# Patient Record
Sex: Female | Born: 1975 | Race: Asian | Hispanic: No | Marital: Married | Smoking: Never smoker
Health system: Southern US, Community
[De-identification: ages and names within clinical notes are randomized; demographics above are authoritative.]

## PROBLEM LIST (undated history)

## (undated) DIAGNOSIS — E7800 Pure hypercholesterolemia, unspecified: Secondary | ICD-10-CM

## (undated) DIAGNOSIS — I10 Essential (primary) hypertension: Secondary | ICD-10-CM

---

## 2002-01-28 ENCOUNTER — Other Ambulatory Visit: Admission: RE | Admit: 2002-01-28 | Discharge: 2002-01-28 | Payer: Self-pay | Admitting: Obstetrics and Gynecology

## 2002-03-21 ENCOUNTER — Ambulatory Visit (HOSPITAL_COMMUNITY): Admission: RE | Admit: 2002-03-21 | Discharge: 2002-03-21 | Payer: Self-pay | Admitting: Obstetrics and Gynecology

## 2002-03-21 ENCOUNTER — Encounter: Payer: Self-pay | Admitting: Obstetrics and Gynecology

## 2002-06-28 ENCOUNTER — Encounter: Admission: RE | Admit: 2002-06-28 | Discharge: 2002-06-28 | Payer: Self-pay | Admitting: Obstetrics and Gynecology

## 2002-07-21 ENCOUNTER — Encounter (HOSPITAL_COMMUNITY): Admission: RE | Admit: 2002-07-21 | Discharge: 2002-08-04 | Payer: Self-pay | Admitting: Obstetrics and Gynecology

## 2002-08-09 ENCOUNTER — Inpatient Hospital Stay (HOSPITAL_COMMUNITY): Admission: AD | Admit: 2002-08-09 | Discharge: 2002-08-13 | Payer: Self-pay | Admitting: Obstetrics and Gynecology

## 2003-08-28 ENCOUNTER — Emergency Department (HOSPITAL_COMMUNITY): Admission: EM | Admit: 2003-08-28 | Discharge: 2003-08-28 | Payer: Self-pay | Admitting: Emergency Medicine

## 2003-08-30 ENCOUNTER — Other Ambulatory Visit: Admission: RE | Admit: 2003-08-30 | Discharge: 2003-08-30 | Payer: Self-pay | Admitting: Obstetrics and Gynecology

## 2004-10-07 ENCOUNTER — Other Ambulatory Visit: Admission: RE | Admit: 2004-10-07 | Discharge: 2004-10-07 | Payer: Self-pay | Admitting: Obstetrics and Gynecology

## 2005-10-19 ENCOUNTER — Inpatient Hospital Stay (HOSPITAL_COMMUNITY): Admission: AD | Admit: 2005-10-19 | Discharge: 2005-10-22 | Payer: Self-pay | Admitting: Obstetrics and Gynecology

## 2006-07-23 ENCOUNTER — Ambulatory Visit: Payer: Self-pay | Admitting: Internal Medicine

## 2010-06-30 HISTORY — PX: BREAST BIOPSY: SHX20

## 2010-07-24 ENCOUNTER — Other Ambulatory Visit: Payer: Self-pay | Admitting: Obstetrics and Gynecology

## 2010-07-24 DIAGNOSIS — N6320 Unspecified lump in the left breast, unspecified quadrant: Secondary | ICD-10-CM

## 2010-07-31 ENCOUNTER — Other Ambulatory Visit: Payer: Self-pay | Admitting: Obstetrics and Gynecology

## 2010-07-31 ENCOUNTER — Ambulatory Visit
Admission: RE | Admit: 2010-07-31 | Discharge: 2010-07-31 | Disposition: A | Discharge status: Home / Self Care | Payer: BC Managed Care – PPO | Source: Ambulatory Visit | Attending: Obstetrics and Gynecology | Admitting: Obstetrics and Gynecology

## 2010-07-31 DIAGNOSIS — N6320 Unspecified lump in the left breast, unspecified quadrant: Secondary | ICD-10-CM

## 2010-07-31 DIAGNOSIS — N6310 Unspecified lump in the right breast, unspecified quadrant: Secondary | ICD-10-CM

## 2010-08-06 ENCOUNTER — Other Ambulatory Visit: Payer: Self-pay | Admitting: Radiology

## 2010-08-06 ENCOUNTER — Ambulatory Visit
Admission: RE | Admit: 2010-08-06 | Discharge: 2010-08-06 | Disposition: A | Discharge status: Home / Self Care | Payer: BC Managed Care – PPO | Source: Ambulatory Visit | Attending: Obstetrics and Gynecology | Admitting: Obstetrics and Gynecology

## 2010-08-06 DIAGNOSIS — N6310 Unspecified lump in the right breast, unspecified quadrant: Secondary | ICD-10-CM

## 2010-08-06 DIAGNOSIS — N6320 Unspecified lump in the left breast, unspecified quadrant: Secondary | ICD-10-CM

## 2011-03-12 ENCOUNTER — Other Ambulatory Visit: Payer: Self-pay | Admitting: Otolaryngology

## 2011-03-12 DIAGNOSIS — R42 Dizziness and giddiness: Secondary | ICD-10-CM

## 2011-03-12 DIAGNOSIS — H905 Unspecified sensorineural hearing loss: Secondary | ICD-10-CM

## 2011-03-24 ENCOUNTER — Ambulatory Visit
Admission: RE | Admit: 2011-03-24 | Discharge: 2011-03-24 | Disposition: A | Discharge status: Home / Self Care | Payer: BC Managed Care – PPO | Source: Ambulatory Visit | Attending: Otolaryngology | Admitting: Otolaryngology

## 2011-03-24 DIAGNOSIS — H905 Unspecified sensorineural hearing loss: Secondary | ICD-10-CM

## 2011-03-24 DIAGNOSIS — R42 Dizziness and giddiness: Secondary | ICD-10-CM

## 2016-12-30 ENCOUNTER — Other Ambulatory Visit: Payer: Self-pay | Admitting: Family Medicine

## 2016-12-30 DIAGNOSIS — Z1231 Encounter for screening mammogram for malignant neoplasm of breast: Secondary | ICD-10-CM

## 2017-01-02 ENCOUNTER — Ambulatory Visit
Admission: RE | Admit: 2017-01-02 | Discharge: 2017-01-02 | Disposition: A | Discharge status: Home / Self Care | Payer: No Typology Code available for payment source | Source: Ambulatory Visit | Attending: Family Medicine | Admitting: Family Medicine

## 2017-01-02 DIAGNOSIS — Z1231 Encounter for screening mammogram for malignant neoplasm of breast: Secondary | ICD-10-CM

## 2017-10-02 ENCOUNTER — Emergency Department (HOSPITAL_COMMUNITY)
Admission: EM | Admit: 2017-10-02 | Discharge: 2017-10-02 | Disposition: A | Discharge status: Home / Self Care | Payer: BLUE CROSS/BLUE SHIELD | Attending: Emergency Medicine | Admitting: Emergency Medicine

## 2017-10-02 ENCOUNTER — Encounter (HOSPITAL_COMMUNITY): Payer: Self-pay | Admitting: Emergency Medicine

## 2017-10-02 ENCOUNTER — Emergency Department (HOSPITAL_COMMUNITY): Payer: BLUE CROSS/BLUE SHIELD

## 2017-10-02 DIAGNOSIS — R109 Unspecified abdominal pain: Secondary | ICD-10-CM | POA: Insufficient documentation

## 2017-10-02 LAB — I-STAT BETA HCG BLOOD, ED (MC, WL, AP ONLY): I-stat hCG, quantitative: <5 m[IU]/mL (ref <5)

## 2017-10-02 LAB — URINALYSIS, ROUTINE W REFLEX MICROSCOPIC
Bilirubin Urine: NEGATIVE
GLUCOSE, UA: NEGATIVE mg/dL
Ketones, ur: NEGATIVE mg/dL
NITRITE: NEGATIVE
Protein, ur: NEGATIVE mg/dL
SPECIFIC GRAVITY, URINE: 1.017 (ref 1.005–1.030)
pH: 6 (ref 5.0–8.0)

## 2017-10-02 LAB — CBC
HCT: 39.9 % (ref 36.0–46.0)
HEMOGLOBIN: 13 g/dL (ref 12.0–15.0)
MCH: 30.5 pg (ref 26.0–34.0)
MCHC: 32.6 g/dL (ref 30.0–36.0)
MCV: 93.7 fL (ref 78.0–100.0)
Platelets: 280 10*3/uL (ref 150–400)
RBC: 4.26 MIL/uL (ref 3.87–5.11)
RDW: 12 % (ref 11.5–15.5)
WBC: 8.9 10*3/uL (ref 4.0–10.5)

## 2017-10-02 LAB — BASIC METABOLIC PANEL
ANION GAP: 10 (ref 5–15)
BUN: 11 mg/dL (ref 6–20)
CHLORIDE: 105 mmol/L (ref 101–111)
CO2: 23 mmol/L (ref 22–32)
Calcium: 8.8 mg/dL — ABNORMAL LOW (ref 8.9–10.3)
Creatinine, Ser: 0.75 mg/dL (ref 0.44–1.00)
GFR calc non Af Amer: >60 mL/min (ref >60)
Glucose, Bld: 105 mg/dL — ABNORMAL HIGH (ref 65–99)
POTASSIUM: 3.6 mmol/L (ref 3.5–5.1)
Sodium: 138 mmol/L (ref 135–145)

## 2017-10-02 LAB — D-DIMER, QUANTITATIVE: D-Dimer, Quant: <0.27 ug/mL-FEU (ref 0.00–0.50)

## 2017-10-02 MED ORDER — ACETAMINOPHEN 325 MG PO TABS
650.0000 mg | ORAL_TABLET | Freq: Once | ORAL | Status: AC
  Administered 2017-10-02: 650 mg via ORAL
  Filled 2017-10-02: qty 2

## 2017-10-02 MED ORDER — IBUPROFEN 400 MG PO TABS
400.0000 mg | ORAL_TABLET | Freq: Once | ORAL | Status: AC | PRN
  Administered 2017-10-02: 400 mg via ORAL
  Filled 2017-10-02: qty 1

## 2017-10-02 NOTE — Discharge Instructions (Addendum)
Labs, CT renal, and urine looked ok today.  The lab test to rule out a blood clot in your lung was negative as well. The likelihood that you have a lung blood clot is very low.   I suspect you may have passed a small kidney stone, which could be causing delayed pain.    Take 1000 mg acetaminophen (tylenol) plus 600 mg ibuprofen (aleve, advil) every 6-8 hours for pain over the next 48 hours. Follow up with your doctor on Monday if pain persists. Return to the emergency department if you have worsening pain, chest pain, shortness of breath, rash, nausea, vomiting, changes in bowel movements or urine.

## 2017-10-02 NOTE — ED Notes (Signed)
BIB EMS from home reporting flank pain onset yesterday evening. Pt denies N/V/D. Denies hx of kidney stones

## 2017-10-02 NOTE — ED Provider Notes (Signed)
[facility]   CSN: [id number] Arrival date & time: 10/02/17  0030     History   Chief Complaint Chief Complaint  Patient presents with  . Flank Pain    HPI Katrina Brennan is a 42 y.o. female w/ no pmh here for evaluation of sudden onset right flank pain that radiates intermittently to right mid abdomen since yesterday morning, gradually worsening until last evening.  Initially worse with walking, yawning, sneezing, taking deep breaths. Pain is better now and mostly in flank, no longer in anterior abdomen. Has taken ibuprofen/tylenol with improvement in pain.  Currently on menstrual cycle day 3.  No fevers, chills, nausea, vomiting, acid reflux, urinary symptoms, abnormal vaginal dc or bleeding, changes in BM, cp, sob. No h/o DVT/PE, recent travel, estrogen use, malignancy. No abdominal surgeries.   HPI  History reviewed. No pertinent past medical history.  There are no active problems to display for this patient.   Past Surgical History:  Procedure Laterality Date  . BREAST BIOPSY Left 2012     OB History   None      Home Medications    Prior to Admission medications   Not on File    Family History No family history on file.  Social History Social History   Tobacco Use  . Smoking status: Never Smoker  . Smokeless tobacco: Never Used  Substance Use Topics  . Alcohol use: Not Currently  . Drug use: Not Currently     Allergies   Patient has no known allergies.   Review of Systems Review of Systems  Gastrointestinal: Positive for abdominal pain.  Genitourinary: Positive for flank pain.  All other systems reviewed and are negative.    Physical Exam Updated Vital Signs BP (!) 141/97   Pulse 70   Temp 98.7 F (37.1 C) (Oral)   Resp 16   SpO2 95%   Physical Exam  Constitutional: She is oriented to person, place, and time. She appears well-developed and well-nourished. No distress.  Non  toxic  HENT:  Head: Normocephalic and atraumatic.  Nose: Nose normal.  Mouth/Throat: No oropharyngeal exudate.  Moist mucous membranes   Eyes: Pupils are equal, round, and reactive to light. Conjunctivae and EOM are normal.  Neck: Normal range of motion.  Cardiovascular: Normal rate, regular rhythm and intact distal pulses.  No murmur heard. 2+ DP and radial pulses bilaterally. No LE edema.   Pulmonary/Chest: Effort normal and breath sounds normal. No respiratory distress. She has no wheezes. She has no rales.  Abdominal: Soft. Bowel sounds are normal. There is tenderness. There is CVA tenderness (right).  R CVAT with percussion not with palpation. No anterior abdominal tenderness, negative Murphy's and McBurney's. No G/R/R. No suprapubic or CVA tenderness.   Musculoskeletal: Normal range of motion. She exhibits no deformity.  Neurological: She is alert and oriented to person, place, and time.  Skin: Skin is warm and dry. Capillary refill takes less than 2 seconds.  Psychiatric: She has a normal mood and affect. Her behavior is normal. Judgment and thought content normal.  Nursing note and vitals reviewed.    ED Treatments / Results  Labs (all labs ordered are listed, but only abnormal results are displayed) Labs Reviewed  URINALYSIS, ROUTINE W REFLEX MICROSCOPIC - Abnormal; Notable for the following components:      Result Value   APPearance HAZY (*)    Hgb urine dipstick LARGE (*)    Leukocytes, UA SMALL (*)  Bacteria, UA RARE (*)    Squamous Epithelial / LPF 6-30 (*)    All other components within normal limits  BASIC METABOLIC PANEL - Abnormal; Notable for the following components:   Glucose, Bld 105 (*)    Calcium 8.8 (*)    All other components within normal limits  CBC  D-DIMER, QUANTITATIVE (NOT AT [facility])  I-STAT BETA HCG BLOOD, ED ([facility], [facility], [facility] ONLY)    EKG None  Radiology Ct Renal Stone Study  Result Date: 10/02/2017 CLINICAL DATA:  Flank pain since yesterday.  Laterality is not indicated. EXAM: CT ABDOMEN AND PELVIS WITHOUT CONTRAST TECHNIQUE: Multidetector CT imaging of the abdomen and pelvis was performed following the standard protocol without IV contrast. COMPARISON:  None. FINDINGS: Lower chest: Mild dependent atelectasis in the lung bases. Hepatobiliary: No focal liver abnormality is seen. No gallstones, gallbladder wall thickening, or biliary dilatation. Pancreas: Unremarkable. No pancreatic ductal dilatation or surrounding inflammatory changes. Spleen: Normal in size without focal abnormality. Adrenals/Urinary Tract: Adrenal glands are unremarkable. Kidneys are normal, without renal calculi, focal lesion, or hydronephrosis. Bladder is unremarkable. Stomach/Bowel: Stomach is within normal limits. Appendix appears normal. No evidence of bowel wall thickening, distention, or inflammatory changes. Vascular/Lymphatic: No significant vascular findings are present. No enlarged abdominal or pelvic lymph nodes. Reproductive: Uterus and bilateral adnexa are unremarkable. Other: No abdominal wall hernia or abnormality. No abdominopelvic ascites. Musculoskeletal: No acute or significant osseous findings. IMPRESSION: 1. No renal or ureteral stone or obstruction. 2. No acute process demonstrated on noncontrast imaging of the abdomen and pelvis. Electronically Signed   By: Burman Nieves M.D.   On: 10/02/2017 04:19    Procedures Procedures (including critical care time)  Medications Ordered in ED Medications  ibuprofen (ADVIL,MOTRIN) tablet 400 mg (400 mg Oral Given 10/02/17 0035)  acetaminophen (TYLENOL) tablet 650 mg (650 mg Oral Given 10/02/17 0035)     Initial Impression / Assessment and Plan / ED Course  I have reviewed the triage vital signs and the nursing notes.  Pertinent labs & imaging results that were available during my care of the patient were reviewed by me and considered in my medical decision making (see chart for details).  Clinical Course as of  Oct 02 1032  Fri Oct 02, 2017  0747 Hgb urine dipstick(!): LARGE [CG]  0747 Leukocytes, UA(!): SMALL [CG]  0747 RBC / HPF: TOO NUMEROUS TO COUNT [CG]  0747 WBC, UA: 6-30 [CG]    Clinical Course User Index [CG] Liberty Handy, PA-C   ddx includes kidney stone, pyelonephritis, MSK injury. Less likely PE, pancreatitis, cholecystitis.  Exam as above with R CVAT, no anterior abdominal tenderness, peritonitis. She is no longer having pleuritic flank pain, making PE less likely.   Labs and imaging reviewed and remarkable for large hgb, small leukocytes however can be explained with current menses, she has no urinary symptoms. D-dimer negative. CT unremarkable. hcg negative.   Final Clinical Impressions(s) / ED Diagnoses   1015: Re-evaluated patient and pain remains in R CVA, no RUQ or epigastric pain. Unclear etiology of pain, possible passed stone. She has no abdominal tenderness, so less likely cholecystitis, pancreatitis, cholelithiasis. Will tx conservative and recommend close f/u with PCP in 48 hours, return to ED if fevers, chills, n/v, worsening abdominal pain.  Final diagnoses:  Right flank pain    ED Discharge Orders    None       Jerrell Mylar 10/02/17 1034    Tegeler, Canary Brim, MD 10/02/17  1843  

## 2017-11-27 ENCOUNTER — Other Ambulatory Visit: Payer: Self-pay | Admitting: Family Medicine

## 2017-11-27 DIAGNOSIS — Z1231 Encounter for screening mammogram for malignant neoplasm of breast: Secondary | ICD-10-CM

## 2018-01-04 ENCOUNTER — Ambulatory Visit
Admission: RE | Admit: 2018-01-04 | Discharge: 2018-01-04 | Disposition: A | Discharge status: Home / Self Care | Payer: BLUE CROSS/BLUE SHIELD | Source: Ambulatory Visit | Attending: Family Medicine | Admitting: Family Medicine

## 2018-01-04 DIAGNOSIS — Z1231 Encounter for screening mammogram for malignant neoplasm of breast: Secondary | ICD-10-CM

## 2018-11-29 ENCOUNTER — Other Ambulatory Visit: Payer: Self-pay | Admitting: Family Medicine

## 2018-11-29 DIAGNOSIS — Z1231 Encounter for screening mammogram for malignant neoplasm of breast: Secondary | ICD-10-CM

## 2019-01-14 ENCOUNTER — Ambulatory Visit
Admission: RE | Admit: 2019-01-14 | Discharge: 2019-01-14 | Disposition: A | Discharge status: Home / Self Care | Payer: BC Managed Care – PPO | Source: Ambulatory Visit | Attending: Family Medicine | Admitting: Family Medicine

## 2019-01-14 DIAGNOSIS — Z1231 Encounter for screening mammogram for malignant neoplasm of breast: Secondary | ICD-10-CM

## 2020-02-17 ENCOUNTER — Other Ambulatory Visit: Payer: Self-pay | Admitting: Family Medicine

## 2020-02-17 DIAGNOSIS — Z1231 Encounter for screening mammogram for malignant neoplasm of breast: Secondary | ICD-10-CM

## 2020-03-08 ENCOUNTER — Ambulatory Visit: Payer: BC Managed Care – PPO

## 2020-03-30 ENCOUNTER — Ambulatory Visit
Admission: RE | Admit: 2020-03-30 | Discharge: 2020-03-30 | Disposition: A | Discharge status: Home / Self Care | Payer: BC Managed Care – PPO | Source: Ambulatory Visit | Attending: Family Medicine | Admitting: Family Medicine

## 2020-03-30 ENCOUNTER — Other Ambulatory Visit: Payer: Self-pay

## 2020-03-30 DIAGNOSIS — Z1231 Encounter for screening mammogram for malignant neoplasm of breast: Secondary | ICD-10-CM

## 2021-04-08 ENCOUNTER — Other Ambulatory Visit: Payer: Self-pay | Admitting: Family Medicine

## 2021-04-08 DIAGNOSIS — Z1231 Encounter for screening mammogram for malignant neoplasm of breast: Secondary | ICD-10-CM

## 2021-04-12 ENCOUNTER — Emergency Department (HOSPITAL_COMMUNITY)
Admission: EM | Admit: 2021-04-12 | Discharge: 2021-04-12 | Disposition: A | Discharge status: Home / Self Care | Payer: BC Managed Care – PPO | Attending: Emergency Medicine | Admitting: Emergency Medicine

## 2021-04-12 ENCOUNTER — Emergency Department (HOSPITAL_COMMUNITY): Payer: BC Managed Care – PPO

## 2021-04-12 ENCOUNTER — Other Ambulatory Visit: Payer: Self-pay

## 2021-04-12 ENCOUNTER — Encounter (HOSPITAL_COMMUNITY): Payer: Self-pay

## 2021-04-12 DIAGNOSIS — R1011 Right upper quadrant pain: Secondary | ICD-10-CM

## 2021-04-12 DIAGNOSIS — K8050 Calculus of bile duct without cholangitis or cholecystitis without obstruction: Secondary | ICD-10-CM

## 2021-04-12 LAB — CBC WITH DIFFERENTIAL/PLATELET
Abs Immature Granulocytes: 0.02 10*3/uL (ref 0.00–0.07)
Basophils Absolute: 0.1 10*3/uL (ref 0.0–0.1)
Basophils Relative: 1 %
Eosinophils Absolute: 0.1 10*3/uL (ref 0.0–0.5)
Eosinophils Relative: 1 %
HCT: 37.2 % (ref 36.0–46.0)
Hemoglobin: 12.4 g/dL (ref 12.0–15.0)
Immature Granulocytes: 0 %
Lymphocytes Relative: 21 %
Lymphs Abs: 2 10*3/uL (ref 0.7–4.0)
MCH: 32.3 pg (ref 26.0–34.0)
MCHC: 33.3 g/dL (ref 30.0–36.0)
MCV: 96.9 fL (ref 80.0–100.0)
Monocytes Absolute: 0.9 10*3/uL (ref 0.1–1.0)
Monocytes Relative: 10 %
Neutro Abs: 6.3 10*3/uL (ref 1.7–7.7)
Neutrophils Relative %: 67 %
Platelets: 241 10*3/uL (ref 150–400)
RBC: 3.84 MIL/uL — ABNORMAL LOW (ref 3.87–5.11)
RDW: 11.9 % (ref 11.5–15.5)
WBC: 9.5 10*3/uL (ref 4.0–10.5)
nRBC: 0 % (ref 0.0–0.2)

## 2021-04-12 LAB — COMPREHENSIVE METABOLIC PANEL
ALT: 13 U/L (ref 0–44)
AST: 17 U/L (ref 15–41)
Albumin: 4 g/dL (ref 3.5–5.0)
Alkaline Phosphatase: 37 U/L — ABNORMAL LOW (ref 38–126)
Anion gap: 7 (ref 5–15)
BUN: 10 mg/dL (ref 6–20)
CO2: 24 mmol/L (ref 22–32)
Calcium: 9.2 mg/dL (ref 8.9–10.3)
Chloride: 104 mmol/L (ref 98–111)
Creatinine, Ser: 0.7 mg/dL (ref 0.44–1.00)
GFR, Estimated: >60 mL/min (ref >60)
Glucose, Bld: 124 mg/dL — ABNORMAL HIGH (ref 70–99)
Potassium: 3.3 mmol/L — ABNORMAL LOW (ref 3.5–5.1)
Sodium: 135 mmol/L (ref 135–145)
Total Bilirubin: 0.4 mg/dL (ref 0.3–1.2)
Total Protein: 7.5 g/dL (ref 6.5–8.1)

## 2021-04-12 LAB — URINALYSIS, ROUTINE W REFLEX MICROSCOPIC
Bilirubin Urine: NEGATIVE
Glucose, UA: NEGATIVE mg/dL
Hgb urine dipstick: NEGATIVE
Ketones, ur: NEGATIVE mg/dL
Leukocytes,Ua: NEGATIVE
Nitrite: NEGATIVE
Protein, ur: NEGATIVE mg/dL
Specific Gravity, Urine: 1.009 (ref 1.005–1.030)
pH: 8 (ref 5.0–8.0)

## 2021-04-12 LAB — LIPASE, BLOOD: Lipase: 30 U/L (ref 11–51)

## 2021-04-12 MED ORDER — OXYCODONE-ACETAMINOPHEN 5-325 MG PO TABS
1.0000 | ORAL_TABLET | Freq: Once | ORAL | Status: AC
  Administered 2021-04-12: 1 via ORAL
  Filled 2021-04-12: qty 1

## 2021-04-12 MED ORDER — HYDROCODONE-ACETAMINOPHEN 5-325 MG PO TABS: 1.0000 | ORAL_TABLET | Freq: Four times a day (QID) | ORAL | 0 refills | Status: DC | PRN

## 2021-04-12 NOTE — Discharge Instructions (Addendum)
You have swelling around your gallbladder.   Take Vicodin for pain.  Call surgery on Monday for appointment  Avoid eating fatty food  Return to ER if you have worse abdominal pain, vomiting, fever

## 2021-04-12 NOTE — ED Triage Notes (Signed)
Pt c/o RUQ pain that radiates to her back since Wednesday. Pt state constant, pulsing pain that has gotten worse. Denies N/V/D.

## 2021-04-12 NOTE — ED Provider Notes (Signed)
Emergency Medicine Provider Triage Evaluation Note  Katrina Brennan , a 45 y.o. female  was evaluated in triage.  Pt complains of right upper quadrant abdominal pain that has been present since Wednesday and worsening.  Described as a pulsing and throbbing pain.  Radiates back to the right side of her back.  No associated nausea or vomiting, normal bowel movements, no dysuria or hematuria, no fevers, no prior abdominal surgeries..  Review of Systems  Positive: Abdominal pain Negative: Dysuria, hematuria, nausea, vomiting, diarrhea, fever  Physical Exam  BP (!) 176/91 (BP Location: Left Arm)   Pulse 90   Temp 98 F (36.7 C) (Oral)   Resp 18   Ht 4\' 11"  (1.499 m)   Wt 64 kg   SpO2 96%   BMI 28.48 kg/m  Gen:   Awake, no distress   Resp:  Normal effort  MSK:   Moves extremities without difficulty  Other:  Focal right upper quadrant abdominal pain with some mild right flank tenderness  Medical Decision Making  Medically screening exam initiated at 11:35 AM.  Appropriate orders placed.  Katrina Brennan was informed that the remainder of the evaluation will be completed by another provider, this initial triage assessment does not replace that evaluation, and the importance of remaining in the ED until their evaluation is complete.     Katrina Giovanni, PA-C 04/12/21 1139    04/14/21, MD 04/13/21 [phone]

## 2021-04-12 NOTE — ED Provider Notes (Signed)
[facility]-EMERGENCY DEPT Provider Note   CSN: [id number] Arrival date & time: 04/12/21  1117     History Chief Complaint  Patient presents with   Abdominal Pain    Katrina Brennan is a 45 y.o. female otherwise healthy here presenting with right upper quadrant pain and back pain.  Patient states that she has been having right upper quadrant pain for the last 2 days.  She states that it is sharp and radiates to the back.  No associated nausea or vomiting.  Denies any previous abdominal surgeries.  Denies any chest pain.  Patient does eat some fatty food.   The history is provided by the patient.      History reviewed. No pertinent past medical history.  There are no problems to display for this patient.   Past Surgical History:  Procedure Laterality Date   BREAST BIOPSY Left 2012     OB History   No obstetric history on file.     History reviewed. No pertinent family history.  Social History   Tobacco Use   Smoking status: Never   Smokeless tobacco: Never  Substance Use Topics   Alcohol use: Not Currently   Drug use: Not Currently    Home Medications Prior to Admission medications   Not on File    Allergies    Patient has no known allergies.  Review of Systems   Review of Systems  Gastrointestinal:  Positive for abdominal pain.  All other systems reviewed and are negative.  Physical Exam Updated Vital Signs BP (!) 163/92   Pulse 82   Temp 98.7 F (37.1 C) (Oral)   Resp 16   Ht 4\' 11"  (1.499 m)   Wt 64 kg   LMP 03/29/2021 Comment: birth contorl  SpO2 100%   BMI 28.48 kg/m   Physical Exam Vitals and nursing note reviewed.  Constitutional:      Comments: Slightly uncomfortable  HENT:     Head: Normocephalic.     Mouth/Throat:     Mouth: Mucous membranes are moist.  Eyes:     Extraocular Movements: Extraocular movements intact.  Cardiovascular:     Rate and Rhythm: Normal rate and regular rhythm.     Heart  sounds: Normal heart sounds.  Pulmonary:     Effort: Pulmonary effort is normal.     Breath sounds: Normal breath sounds.  Abdominal:     General: Abdomen is flat.     Comments: Mild right upper quadrant tenderness.  Negative Murphy sign.  No CVAT  Skin:    General: Skin is warm.     Capillary Refill: Capillary refill takes less than 2 seconds.  Neurological:     General: No focal deficit present.     Mental Status: She is alert and oriented to person, place, and time.  Psychiatric:        Mood and Affect: Mood normal.        Behavior: Behavior normal.    ED Results / Procedures / Treatments   Labs (all labs ordered are listed, but only abnormal results are displayed) Labs Reviewed  COMPREHENSIVE METABOLIC PANEL - Abnormal; Notable for the following components:      Result Value   Potassium 3.3 (*)    Glucose, Bld 124 (*)    Alkaline Phosphatase 37 (*)    All other components within normal limits  CBC WITH DIFFERENTIAL/PLATELET - Abnormal; Notable for the following components:   RBC 3.84 (*)    All  other components within normal limits  URINALYSIS, ROUTINE W REFLEX MICROSCOPIC - Abnormal; Notable for the following components:   APPearance TURBID (*)    Bacteria, UA RARE (*)    All other components within normal limits  LIPASE, BLOOD    EKG None  Radiology US Abdomen Limited RUQ (LIVER/GB)  Result Date: 04/12/2021 CLINICAL DATA:  Severe right upper quadrant pain. EXAM: ULTRASOUND ABDOMEN LIMITED RIGHT UPPER QUADRANT COMPARISON:  CT AP 10/02/2017 FINDINGS: Gallbladder: The gallbladder is collapsed. The gallbladder wall appears mildly thickened measuring 3 mm. No gallstones, sludge or pericholecystic fluid. Positive sonographic Murphy's sign reported. Gallbladder polyp is noted measuring 3 mm. Common bile duct: Diameter: 6.1 mm.  Normal diameter is considered to be 6 mm or less. Liver: No focal lesion identified. Within normal limits in parenchymal echogenicity. Portal vein  is patent on color Doppler imaging with normal direction of blood flow towards the liver. Other: None. IMPRESSION: 1. Collapsed gallbladder with mild, nonspecific, gallbladder wall thickening. A positive sonographic Murphy's sign was reported by the sonographer. No gallstones, sludge or pericholecystic fluid noted. 2. Upper limits of normal in caliber common bile duct. 3. Gallbladder polyp measures 3 mm. Electronically Signed   By: Signa Kell M.D.   On: 04/12/2021 13:11    Procedures Procedures   Medications Ordered in ED Medications  oxyCODONE-acetaminophen (PERCOCET/ROXICET) 5-325 MG per tablet 1 tablet (has no administration in time range)    ED Course  I have reviewed the triage vital signs and the nursing notes.  Pertinent labs & imaging results that were available during my care of the patient were reviewed by me and considered in my medical decision making (see chart for details).    MDM Rules/Calculators/A&P                           Katrina Brennan is a 45 y.o. female here presenting with right upper quadrant pain radiated to the back.  Concern for possible biliary colic.  Patient has right upper quadrant tenderness.  She overall appears well.  She has no vomiting or fever.  She is hypertensive likely due to pain.  I do not think she has dissection.  Right upper quadrant ultrasound showed gallbladder wall thickening with possible sonographic Murphy's but there is no other concerning signs for acute Coley.  Her white blood cell count is normal LFTs are normal.  I think she likely has biliary colic causing her symptoms.  Since she is pain-free now, will prescribe pain medicine and have her call surgery on Monday to schedule for follow-up and potential surgery.  Gave strict return precautions  Final Clinical Impression(s) / ED Diagnoses Final diagnoses:  RUQ abdominal pain    Rx / DC Orders ED Discharge Orders     None        Charlynne Pander, MD 04/12/21 [phone]

## 2021-04-29 ENCOUNTER — Ambulatory Visit: Payer: Self-pay | Admitting: Surgery

## 2021-04-29 NOTE — H&P (Signed)
History of Present Illness: Katrina Brennan is a 45 y.o. female who was referred to me for evaluation of RUQ abdominal pain. She recently presented to the ED on 04/12/21 with severe upper abdominal pain that radiated around the right flank to the back.  Ultrasound showed mild gallbladder wall thickening with a 65mm polyp, but no stones or sludge and no pericholecystic fluid. LFTs, WBC and lipase were normal. She was referred to discuss elective cholecystectomy. She had a similar episode in 2019 and thought she had a kidney stone, but CT scan at that time showed no nephroureterolithiasis. She says that she often has RUQ discomfort, particularly after eating spicy or fatty foods. She had recently eaten greasy food prior to her most recent episode of pain. She has been avoiding those foods since her ED visit and says this has helped with her symptoms.       Review of Systems: A complete review of systems was obtained from the patient.  I have reviewed this information and discussed as appropriate with the patient.  See HPI as well for other ROS.       Medical History: Past Medical History Past Medical History: Diagnosis Date  Hyperlipidemia    Hypertension        Patient Active Problem List Diagnosis  RUQ pain  Biliary colic     Past Surgical History Past Surgical History: Procedure Laterality Date  CESAREAN SECTION       2004 and 2007      Allergies No Known Allergies    Current Outpatient Medications on File Prior to Visit Medication Sig Dispense Refill  rosuvastatin (CRESTOR) 5 MG tablet Take 5 mg by mouth once daily      valsartan (DIOVAN) 80 MG tablet Take 80 mg by mouth once daily       No current facility-administered medications on file prior to visit.     Family History Family History Problem Relation Age of Onset  Asthma Mother        Social History   Tobacco Use Smoking Status Never Smoker Smokeless Tobacco Never Used     Social History Social  History    Socioeconomic History  Marital status: Married Tobacco Use  Smoking status: Never Smoker  Smokeless tobacco: Never Used Advertising account planner Use: Never used Substance and Sexual Activity  Alcohol use: Yes  Drug use: Defer  Sexual activity: Defer      Objective:     Vitals:   04/29/21 1327 BP: 134/62 Pulse: 74 Temp: 36.9 C (98.4 F) SpO2: 99% Weight: 64 kg (141 lb 3.2 oz) Height: 149.9 cm (4\' 11" )   Body mass index is 28.52 kg/m.   Physical Exam Vitals reviewed.  Constitutional:      General: She is not in acute distress.    Appearance: Normal appearance.  HENT:     Head: Normocephalic and atraumatic.  Eyes:     General: No scleral icterus.    Conjunctiva/sclera: Conjunctivae normal.  Cardiovascular:     Rate and Rhythm: Normal rate and regular rhythm.     Heart sounds: No murmur heard. Pulmonary:     Effort: Pulmonary effort is normal. No respiratory distress.     Breath sounds: Normal breath sounds. No wheezing.  Abdominal:     General: There is no distension.     Tenderness: There is no abdominal tenderness. There is no guarding.  Musculoskeletal:        General: No swelling or deformity. Normal range of motion.  Cervical back: Normal range of motion.  Skin:    General: Skin is warm and dry.     Coloration: Skin is not jaundiced.  Neurological:     General: No focal deficit present.     Mental Status: She is alert and oriented to person, place, and time.     Cranial Nerves: No cranial nerve deficit.  Psychiatric:        Mood and Affect: Mood normal.        Behavior: Behavior normal.        Thought Content: Thought content normal.            Assessment and Plan: Diagnoses and all orders for this visit:   RUQ pain   Biliary colic        This is a 45 year old female presenting with postprandial right upper quadrant discomfort and a recent episode of severe right upper quadrant pain.  I reviewed her ultrasound, which shows  thickening of the gallbladder wall but no obvious gallstones or sludge.  I discussed that typically gallbladder pain is caused by gallstones, however a functional gallbladder abnormality (I.e. biliary dyskinesia) can also cause the same symptoms.  She does not have stones on her ultrasound, but her symptoms are very typical of biliary colic and her gallbladder appears thickened and abnormal, suggesting a degree of chronic inflammation.  I discussed that we could obtain a HIDA to confirm the diagnosis of biliary dyskinesia, however since her symptoms are very typical of biliary colic and her gallbladder appears abnormal on imaging, I think it is reasonable to proceed with cholecystectomy.  I discussed the details of this procedure with the patient, including the risks of bleeding, infection, bile leak, and <0.5% risk of common bile duct injury. The patient expressed understanding and agrees to proceed with surgery.  She will be contacted to schedule an elective surgery date.  Sophronia Simas, MD [facility] Surgery General, Hepatobiliary and Pancreatic Surgery 04/29/21 2:10 PM

## 2021-04-30 ENCOUNTER — Emergency Department (HOSPITAL_BASED_OUTPATIENT_CLINIC_OR_DEPARTMENT_OTHER): Payer: BC Managed Care – PPO | Admitting: Radiology

## 2021-04-30 ENCOUNTER — Other Ambulatory Visit: Payer: Self-pay

## 2021-04-30 ENCOUNTER — Encounter (HOSPITAL_BASED_OUTPATIENT_CLINIC_OR_DEPARTMENT_OTHER): Payer: Self-pay

## 2021-04-30 ENCOUNTER — Emergency Department (HOSPITAL_BASED_OUTPATIENT_CLINIC_OR_DEPARTMENT_OTHER)
Admission: EM | Admit: 2021-04-30 | Discharge: 2021-04-30 | Disposition: A | Discharge status: Home / Self Care | Payer: BC Managed Care – PPO | Attending: Student | Admitting: Student

## 2021-04-30 DIAGNOSIS — T189XXA Foreign body of alimentary tract, part unspecified, initial encounter: Secondary | ICD-10-CM | POA: Diagnosis present

## 2021-04-30 DIAGNOSIS — X58XXXA Exposure to other specified factors, initial encounter: Secondary | ICD-10-CM | POA: Insufficient documentation

## 2021-04-30 DIAGNOSIS — R0989 Other specified symptoms and signs involving the circulatory and respiratory systems: Secondary | ICD-10-CM

## 2021-04-30 MED ORDER — LIDOCAINE VISCOUS HCL 2 % MT SOLN: 15.0000 mL | OROMUCOSAL | 0 refills | Status: AC | PRN

## 2021-04-30 MED ORDER — LIDOCAINE VISCOUS HCL 2 % MT SOLN
15.0000 mL | Freq: Once | OROMUCOSAL | Status: AC
  Administered 2021-04-30: 15 mL via OROMUCOSAL
  Filled 2021-04-30: qty 15

## 2021-04-30 NOTE — ED Triage Notes (Signed)
States eating felt chicken bone stuck in throat about 1 hour ago.  Went to UC first.  No sore of breath.  Pain to throat.

## 2021-04-30 NOTE — ED Provider Notes (Signed)
[facility] EMERGENCY DEPT Provider Note   CSN: [id number] Arrival date & time: 04/30/21  1348     History Chief Complaint  Patient presents with   Sore Throat    Chicken bone stuck in throat    Katrina Brennan is a 45 y.o. female who presents the emergency department for evaluation of a chicken bone ingestion and sore throat.  Patient states that proximately 5 hours prior to arrival she accidentally ingested a small chicken bone and feels a foreign body sensation in her throat.  She states that she went to an urgent care who told the patient that she might need to have surgery and to go to the emergency department immediately.  Patient has no complaints of dyspnea, wheezing, nausea, vomiting and has been able to tolerate both food and liquids since ingesting the chicken bone.  She has no abdominal pain and complains of a foreign body sensation in the throat.   Sore Throat Pertinent negatives include no chest pain, no abdominal pain and no shortness of breath.      No past medical history on file.  There are no problems to display for this patient.   Past Surgical History:  Procedure Laterality Date   BREAST BIOPSY Left 2012     OB History   No obstetric history on file.     No family history on file.  Social History   Tobacco Use   Smoking status: Never   Smokeless tobacco: Never  Substance Use Topics   Alcohol use: Not Currently   Drug use: Not Currently    Home Medications Prior to Admission medications   Medication Sig Start Date End Date Taking? Authorizing Provider  lidocaine (XYLOCAINE) 2 % solution Use as directed 15 mLs in the mouth or throat as needed for mouth pain. 04/30/21  Yes Saloma Cadena, MD  HYDROcodone-acetaminophen (NORCO/VICODIN) 5-325 MG tablet Take 1 tablet by mouth every 6 (six) hours as needed. 04/12/21   Charlynne Pander, MD    Allergies    Patient has no known allergies.  Review of Systems   Review of Systems   Constitutional:  Negative for chills and fever.  HENT:  Positive for sore throat. Negative for ear pain.   Eyes:  Negative for pain and visual disturbance.  Respiratory:  Negative for cough and shortness of breath.   Cardiovascular:  Negative for chest pain and palpitations.  Gastrointestinal:  Negative for abdominal pain and vomiting.  Genitourinary:  Negative for dysuria and hematuria.  Musculoskeletal:  Negative for arthralgias and back pain.  Skin:  Negative for color change and rash.  Neurological:  Negative for seizures and syncope.  All other systems reviewed and are negative.  Physical Exam Updated Vital Signs BP (!) 149/89 (BP Location: Right Arm)   Pulse 72   Temp 98.4 F (36.9 C) (Oral)   Resp 16   Ht 4\' 11"  (1.499 m)   Wt 64 kg   SpO2 100%   BMI 28.48 kg/m   Physical Exam Vitals and nursing note reviewed.  Constitutional:      General: She is not in acute distress.    Appearance: She is well-developed.  HENT:     Head: Normocephalic and atraumatic.  Eyes:     Conjunctiva/sclera: Conjunctivae normal.  Cardiovascular:     Rate and Rhythm: Normal rate and regular rhythm.     Heart sounds: No murmur heard. Pulmonary:     Effort: Pulmonary effort is normal. No respiratory distress.  Breath sounds: Normal breath sounds.  Abdominal:     Palpations: Abdomen is soft.     Tenderness: There is no abdominal tenderness.  Musculoskeletal:     Cervical back: Neck supple.  Skin:    General: Skin is warm and dry.  Neurological:     Mental Status: She is alert.    ED Results / Procedures / Treatments   Labs (all labs ordered are listed, but only abnormal results are displayed) Labs Reviewed - No data to display  EKG None  Radiology DG Neck Soft Tissue  Result Date: 04/30/2021 CLINICAL DATA:  Swallowed fish bone. Retained foreign body sensation in esophagus. EXAM: NECK SOFT TISSUES - 1+ VIEW COMPARISON:  None. FINDINGS: There is no evidence of  retropharyngeal soft tissue swelling or epiglottic enlargement. No foreign body identified in the region of the cervical esophagus. The cervical airway is unremarkable and no radio-opaque foreign body identified. IMPRESSION: Negative.  No foreign body identified. Electronically Signed   By: Ronney Asters M.D.   On: 04/30/2021 17:36    Procedures Procedures   Medications Ordered in ED Medications  lidocaine (XYLOCAINE) 2 % viscous mouth solution 15 mL (15 mLs Mouth/Throat Given 04/30/21 1744)    ED Course  I have reviewed the triage vital signs and the nursing notes.  Pertinent labs & imaging results that were available during my care of the patient were reviewed by me and considered in my medical decision making (see chart for details).    MDM Rules/Calculators/A&P                           Patient seen emergency department for evaluation of a foreign body sensation after ingesting chicken bone.  Physical exam is unremarkable.  X-ray soft tissue neck with no retained foreign body.  Patient presentation consistent with a globus sensation and she was given viscous lidocaine to soothe her symptoms.  She was given strict return precautions which include worsening abdominal pain, persistent vomiting, fever and she voiced understanding of this.  She was encouraged to examine her stools for removal of the chicken bone and she was discharged.  The patient does not require surgery or endoscopy at this time. Final Clinical Impression(s) / ED Diagnoses Final diagnoses:  Globus sensation    Rx / DC Orders ED Discharge Orders          Ordered    lidocaine (XYLOCAINE) 2 % solution  As needed        04/30/21 1740             Elmina Hendel, [place], MD 04/30/21 1749

## 2021-05-08 ENCOUNTER — Ambulatory Visit: Payer: BC Managed Care – PPO

## 2021-05-21 NOTE — Patient Instructions (Signed)
DUE TO COVID-19 ONLY ONE VISITOR IS ALLOWED TO COME WITH YOU AND STAY IN THE WAITING ROOM ONLY DURING PRE OP AND PROCEDURE DAY OF SURGERY IF YOU ARE GOING HOME AFTER SURGERY. IF YOU ARE SPENDING THE NIGHT 2 PEOPLE MAY VISIT WITH YOU IN YOUR PRIVATE ROOM AFTER SURGERY UNTIL VISITING  HOURS ARE OVER AT 800 PM AND 1  VISITOR  MAY  SPEND THE NIGHT.                 Katrina Brennan     Your procedure is scheduled on: 05/31/21   Report to [facility] Main  Entrance   Report to short stay at 5:15 AM     Call this number if you have problems the morning of surgery [phone]    Remember: Do not eat food or drink :After Midnight the night before your surgery,     BRUSH YOUR TEETH MORNING OF SURGERY AND RINSE YOUR MOUTH OUT, NO CHEWING GUM CANDY OR MINTS.     Take these medicines the morning of surgery with A SIP OF WATER: none                                You may not have any metal on your body including hair pins and              piercings  Do not wear jewelry, make-up, lotions, powders or perfumes, deodorant             Do not wear nail polish on your fingernails.  Do not shave  48 hours prior to surgery.                 Do not bring valuables to the hospital. [facility] IS NOT             RESPONSIBLE   FOR VALUABLES.  Contacts, dentures or bridgework may not be worn into surgery.       Patients discharged the day of surgery will not be allowed to drive home.  IF YOU ARE HAVING SURGERY AND GOING HOME THE SAME DAY, YOU MUST HAVE AN ADULT TO DRIVE YOU HOME AND BE WITH YOU FOR 24 HOURS. YOU MAY GO HOME BY TAXI OR UBER OR ORTHERWISE, BUT AN ADULT MUST ACCOMPANY YOU HOME AND STAY WITH YOU FOR 24 HOURS.  Name and phone number of your driver:  Special Instructions: N/A              Please read over the following fact sheets you were given: _____________________________________________________________________             [facility] - Preparing for Surgery Before surgery,  you can play an important role.  Because skin is not sterile, your skin needs to be as free of germs as possible.  You can reduce the number of germs on your skin by washing with CHG (chlorahexidine gluconate) soap before surgery.  CHG is an antiseptic cleaner which kills germs and bonds with the skin to continue killing germs even after washing. Please DO NOT use if you have an allergy to CHG or antibacterial soaps.  If your skin becomes reddened/irritated stop using the CHG and inform your nurse when you arrive at Short Stay. Do not shave (including legs and underarms) for at least 48 hours prior to the first CHG shower.   Please follow these instructions carefully:  1.  Shower with  CHG Soap the night before surgery and the  morning of Surgery.  2.  If you choose to wash your hair, wash your hair first as usual with your  normal  shampoo.  3.  After you shampoo, rinse your hair and body thoroughly to remove the  shampoo.                            4.  Use CHG as you would any other liquid soap.  You can apply chg directly  to the skin and wash                       Gently with a scrungie or clean washcloth.  5.  Apply the CHG Soap to your body ONLY FROM THE NECK DOWN.   Do not use on face/ open                           Wound or open sores. Avoid contact with eyes, ears mouth and genitals (private parts).                       Wash face,  Genitals (private parts) with your normal soap.             6.  Wash thoroughly, paying special attention to the area where your surgery  will be performed.  7.  Thoroughly rinse your body with warm water from the neck down.  8.  DO NOT shower/wash with your normal soap after using and rinsing off  the CHG Soap.                9.  Pat yourself dry with a clean towel.            10.  Wear clean pajamas.            11.  Place clean sheets on your bed the night of your first shower and do not  sleep with pets. Day of Surgery : Do not apply any lotions/deodorants  the morning of surgery.  Please wear clean clothes to the hospital/surgery center.  FAILURE TO FOLLOW THESE INSTRUCTIONS MAY RESULT IN THE CANCELLATION OF YOUR SURGERY PATIENT SIGNATURE_________________________________  NURSE SIGNATURE__________________________________  ________________________________________________________________________

## 2021-05-22 ENCOUNTER — Encounter (HOSPITAL_COMMUNITY)
Admission: RE | Admit: 2021-05-22 | Discharge: 2021-05-22 | Disposition: A | Discharge status: Home / Self Care | Payer: BC Managed Care – PPO | Source: Ambulatory Visit | Attending: Surgery | Admitting: Surgery

## 2021-05-22 ENCOUNTER — Encounter (HOSPITAL_COMMUNITY): Payer: Self-pay

## 2021-05-22 ENCOUNTER — Other Ambulatory Visit: Payer: Self-pay

## 2021-05-22 VITALS — BP 133/83 | HR 72 | Temp 98.3°F | Resp 18 | Ht 59.0 in | Wt 138.0 lb

## 2021-05-22 DIAGNOSIS — Z01818 Encounter for other preprocedural examination: Secondary | ICD-10-CM

## 2021-05-22 DIAGNOSIS — Z01812 Encounter for preprocedural laboratory examination: Secondary | ICD-10-CM | POA: Diagnosis present

## 2021-05-22 HISTORY — DX: Pure hypercholesterolemia, unspecified: E78.00

## 2021-05-22 HISTORY — DX: Essential (primary) hypertension: I10

## 2021-05-22 LAB — CBC
HCT: 34.8 % — ABNORMAL LOW (ref 36.0–46.0)
Hemoglobin: 11.3 g/dL — ABNORMAL LOW (ref 12.0–15.0)
MCH: 31.7 pg (ref 26.0–34.0)
MCHC: 32.5 g/dL (ref 30.0–36.0)
MCV: 97.8 fL (ref 80.0–100.0)
Platelets: 281 10*3/uL (ref 150–400)
RBC: 3.56 MIL/uL — ABNORMAL LOW (ref 3.87–5.11)
RDW: 11.6 % (ref 11.5–15.5)
WBC: 5.4 10*3/uL (ref 4.0–10.5)
nRBC: 0 % (ref 0.0–0.2)

## 2021-05-22 NOTE — Progress Notes (Signed)
COVID test- NA   PCP - Dr. Loreen Freud Cardiologist - none  Chest x-ray - no EKG - no Stress Test - no ECHO - no Cardiac Cath - no Pacemaker/ICD device last checked:NA  Sleep Study - no CPAP -   Fasting Blood Sugar - NA Checks Blood Sugar _____ times a day  Blood Thinner Instructions:NA Aspirin Instructions: Last Dose:  Anesthesia review: no  Patient denies shortness of breath, fever, cough and chest pain at PAT appointment Pt has no SOB with any activities  Patient verbalized understanding of instructions that were given to them at the PAT appointment. Patient was also instructed that they will need to review over the PAT instructions again at home before surgery. yes

## 2021-05-30 NOTE — Anesthesia Preprocedure Evaluation (Addendum)
Anesthesia Evaluation  Patient identified by MRN, date of birth, ID band Patient awake    Reviewed: Allergy & Precautions, NPO status , Patient's Chart, lab work & pertinent test results  Airway Mallampati: II  TM Distance: >3 FB Neck ROM: Full    Dental no notable dental hx. (+) Implants, Dental Advisory Given, Teeth Intact   Pulmonary neg pulmonary ROS,    Pulmonary exam normal breath sounds clear to auscultation       Cardiovascular hypertension, Pt. on medications Normal cardiovascular exam Rhythm:Regular Rate:Normal     Neuro/Psych negative neurological ROS  negative psych ROS   GI/Hepatic negative GI ROS, Neg liver ROS,   Endo/Other  negative endocrine ROS  Renal/GU Lab Results      Component                Value               Date                      CREATININE               0.70                04/12/2021                BUN                      10                  04/12/2021                NA                       135                 04/12/2021                K                        3.3 (L)             04/12/2021                CL                       104                 04/12/2021                CO2                      24                  04/12/2021                Musculoskeletal negative musculoskeletal ROS (+)   Abdominal   Peds  Hematology Lab Results      Component                Value               Date                      WBC  5.4                 05/22/2021                HGB                      11.3 (L)            05/22/2021                HCT                      34.8 (L)            05/22/2021                MCV                      97.8                05/22/2021                PLT                      281                 05/22/2021              Anesthesia Other Findings   Reproductive/Obstetrics negative OB ROS                             Anesthesia Physical Anesthesia Plan  ASA: 2  Anesthesia Plan: General   Post-op Pain Management: Dilaudid IV and Tylenol PO (pre-op)   Induction: Intravenous  PONV Risk Score and Plan: 4 or greater and Midazolam, Dexamethasone, Ondansetron and Treatment may vary due to age or medical condition  Airway Management Planned: Oral ETT  Additional Equipment: None  Intra-op Plan:   Post-operative Plan: Extubation in OR  Informed Consent: I have reviewed the patients History and Physical, chart, labs and discussed the procedure including the risks, benefits and alternatives for the proposed anesthesia with the patient or authorized representative who has indicated his/her understanding and acceptance.     Dental advisory given  Plan Discussed with: CRNA and Anesthesiologist  Anesthesia Plan Comments: (GA ETT)       Anesthesia Quick Evaluation

## 2021-05-31 ENCOUNTER — Ambulatory Visit (HOSPITAL_COMMUNITY): Payer: BC Managed Care – PPO | Admitting: Certified Registered Nurse Anesthetist

## 2021-05-31 ENCOUNTER — Encounter (HOSPITAL_COMMUNITY): Payer: Self-pay | Admitting: Surgery

## 2021-05-31 ENCOUNTER — Ambulatory Visit (HOSPITAL_COMMUNITY)
Admission: RE | Admit: 2021-05-31 | Discharge: 2021-05-31 | Disposition: A | Discharge status: Home / Self Care | Payer: BC Managed Care – PPO | Attending: Surgery | Admitting: Surgery

## 2021-05-31 ENCOUNTER — Encounter (HOSPITAL_COMMUNITY)
Admission: RE | Disposition: A | Discharge status: Home / Self Care | Payer: Self-pay | Source: Home / Self Care | Attending: Surgery

## 2021-05-31 DIAGNOSIS — K8044 Calculus of bile duct with chronic cholecystitis without obstruction: Secondary | ICD-10-CM | POA: Insufficient documentation

## 2021-05-31 DIAGNOSIS — K828 Other specified diseases of gallbladder: Secondary | ICD-10-CM | POA: Diagnosis present

## 2021-05-31 HISTORY — PX: CHOLECYSTECTOMY: SHX55

## 2021-05-31 LAB — PREGNANCY, URINE: Preg Test, Ur: NEGATIVE

## 2021-05-31 SURGERY — LAPAROSCOPIC CHOLECYSTECTOMY
Anesthesia: General

## 2021-05-31 MED ORDER — CHLORHEXIDINE GLUCONATE 0.12 % MT SOLN
15.0000 mL | Freq: Once | OROMUCOSAL | Status: AC
  Administered 2021-05-31: 15 mL via OROMUCOSAL

## 2021-05-31 MED ORDER — ORAL CARE MOUTH RINSE: 15.0000 mL | Freq: Once | OROMUCOSAL | Status: AC

## 2021-05-31 MED ORDER — PROPOFOL 10 MG/ML IV BOLUS
INTRAVENOUS | Status: DC | PRN
  Administered 2021-05-31: 100 mg via INTRAVENOUS

## 2021-05-31 MED ORDER — DEXMEDETOMIDINE (PRECEDEX) IN NS 20 MCG/5ML (4 MCG/ML) IV SYRINGE
PREFILLED_SYRINGE | INTRAVENOUS | Status: DC | PRN
  Administered 2021-05-31: 8 ug via INTRAVENOUS

## 2021-05-31 MED ORDER — LIDOCAINE 2% (20 MG/ML) 5 ML SYRINGE
INTRAMUSCULAR | Status: DC | PRN
  Administered 2021-05-31: 80 mg via INTRAVENOUS

## 2021-05-31 MED ORDER — BUPIVACAINE-EPINEPHRINE (PF) 0.25% -1:200000 IJ SOLN
INTRAMUSCULAR | Status: AC
  Filled 2021-05-31: qty 30

## 2021-05-31 MED ORDER — KETOROLAC TROMETHAMINE 30 MG/ML IJ SOLN
30.0000 mg | Freq: Once | INTRAMUSCULAR | Status: AC | PRN
  Administered 2021-05-31: 30 mg via INTRAVENOUS

## 2021-05-31 MED ORDER — GABAPENTIN 300 MG PO CAPS
300.0000 mg | ORAL_CAPSULE | ORAL | Status: AC
  Administered 2021-05-31: 300 mg via ORAL
  Filled 2021-05-31: qty 1

## 2021-05-31 MED ORDER — PHENYLEPHRINE 40 MCG/ML (10ML) SYRINGE FOR IV PUSH (FOR BLOOD PRESSURE SUPPORT)
PREFILLED_SYRINGE | INTRAVENOUS | Status: AC
  Filled 2021-05-31: qty 10

## 2021-05-31 MED ORDER — HYDROCODONE-ACETAMINOPHEN 5-325 MG PO TABS: 1.0000 | ORAL_TABLET | Freq: Four times a day (QID) | ORAL | 0 refills | Status: AC | PRN

## 2021-05-31 MED ORDER — LIDOCAINE HCL (PF) 2 % IJ SOLN
INTRAMUSCULAR | Status: AC
  Filled 2021-05-31: qty 5

## 2021-05-31 MED ORDER — SODIUM CHLORIDE 0.9 % IR SOLN
Status: DC | PRN
  Administered 2021-05-31: 1000 mL

## 2021-05-31 MED ORDER — DEXAMETHASONE SODIUM PHOSPHATE 10 MG/ML IJ SOLN
INTRAMUSCULAR | Status: DC | PRN
  Administered 2021-05-31: 10 mg via INTRAVENOUS

## 2021-05-31 MED ORDER — ACETAMINOPHEN 500 MG PO TABS
1000.0000 mg | ORAL_TABLET | ORAL | Status: AC
  Administered 2021-05-31: 1000 mg via ORAL
  Filled 2021-05-31: qty 2

## 2021-05-31 MED ORDER — MIDAZOLAM HCL 5 MG/5ML IJ SOLN
INTRAMUSCULAR | Status: DC | PRN
Start: 2021-05-31 — End: 2021-05-31
  Administered 2021-05-31: 2 mg via INTRAVENOUS

## 2021-05-31 MED ORDER — FENTANYL CITRATE (PF) 100 MCG/2ML IJ SOLN
INTRAMUSCULAR | Status: DC | PRN
  Administered 2021-05-31 (×3): 50 ug via INTRAVENOUS

## 2021-05-31 MED ORDER — MIDAZOLAM HCL 2 MG/2ML IJ SOLN
INTRAMUSCULAR | Status: AC
  Filled 2021-05-31: qty 2

## 2021-05-31 MED ORDER — ROCURONIUM BROMIDE 10 MG/ML (PF) SYRINGE
PREFILLED_SYRINGE | INTRAVENOUS | Status: DC | PRN
  Administered 2021-05-31: 50 mg via INTRAVENOUS

## 2021-05-31 MED ORDER — ONDANSETRON HCL 4 MG/2ML IJ SOLN: 4.0000 mg | Freq: Once | INTRAMUSCULAR | Status: DC | PRN

## 2021-05-31 MED ORDER — HYDROMORPHONE HCL 1 MG/ML IJ SOLN: 0.2500 mg | INTRAMUSCULAR | Status: DC | PRN

## 2021-05-31 MED ORDER — PROPOFOL 10 MG/ML IV BOLUS
INTRAVENOUS | Status: AC
  Filled 2021-05-31: qty 20

## 2021-05-31 MED ORDER — FENTANYL CITRATE (PF) 250 MCG/5ML IJ SOLN
INTRAMUSCULAR | Status: AC
  Filled 2021-05-31: qty 5

## 2021-05-31 MED ORDER — CEFAZOLIN SODIUM-DEXTROSE 2-4 GM/100ML-% IV SOLN
2.0000 g | INTRAVENOUS | Status: AC
  Administered 2021-05-31: 2 g via INTRAVENOUS
  Filled 2021-05-31: qty 100

## 2021-05-31 MED ORDER — RINGERS IRRIGATION IR SOLN
Status: DC | PRN
  Administered 2021-05-31: 1

## 2021-05-31 MED ORDER — BUPIVACAINE-EPINEPHRINE 0.25% -1:200000 IJ SOLN
INTRAMUSCULAR | Status: DC | PRN
  Administered 2021-05-31: 30 mL

## 2021-05-31 MED ORDER — OXYCODONE HCL 5 MG PO TABS: 5.0000 mg | ORAL_TABLET | Freq: Once | ORAL | Status: DC | PRN

## 2021-05-31 MED ORDER — ONDANSETRON HCL 4 MG/2ML IJ SOLN
INTRAMUSCULAR | Status: DC | PRN
  Administered 2021-05-31: 4 mg via INTRAVENOUS

## 2021-05-31 MED ORDER — KETOROLAC TROMETHAMINE 30 MG/ML IJ SOLN
INTRAMUSCULAR | Status: AC
  Filled 2021-05-31: qty 1

## 2021-05-31 MED ORDER — OXYCODONE HCL 5 MG/5ML PO SOLN: 5.0000 mg | Freq: Once | ORAL | Status: DC | PRN

## 2021-05-31 MED ORDER — DEXAMETHASONE SODIUM PHOSPHATE 10 MG/ML IJ SOLN
INTRAMUSCULAR | Status: AC
  Filled 2021-05-31: qty 1

## 2021-05-31 MED ORDER — LACTATED RINGERS IV SOLN: INTRAVENOUS | Status: DC

## 2021-05-31 MED ORDER — ROCURONIUM BROMIDE 10 MG/ML (PF) SYRINGE
PREFILLED_SYRINGE | INTRAVENOUS | Status: AC
  Filled 2021-05-31: qty 10

## 2021-05-31 MED ORDER — PHENYLEPHRINE 40 MCG/ML (10ML) SYRINGE FOR IV PUSH (FOR BLOOD PRESSURE SUPPORT)
PREFILLED_SYRINGE | INTRAVENOUS | Status: DC | PRN
  Administered 2021-05-31: 80 ug via INTRAVENOUS
  Administered 2021-05-31: 40 ug via INTRAVENOUS
  Administered 2021-05-31: 80 ug via INTRAVENOUS

## 2021-05-31 MED ORDER — SUGAMMADEX SODIUM 200 MG/2ML IV SOLN
INTRAVENOUS | Status: DC | PRN
  Administered 2021-05-31: 200 mg via INTRAVENOUS

## 2021-05-31 MED ORDER — ONDANSETRON HCL 4 MG/2ML IJ SOLN
INTRAMUSCULAR | Status: AC
  Filled 2021-05-31: qty 2

## 2021-05-31 MED ORDER — HYDROMORPHONE HCL 2 MG/ML IJ SOLN
INTRAMUSCULAR | Status: AC
  Filled 2021-05-31: qty 1

## 2021-05-31 MED ORDER — 0.9 % SODIUM CHLORIDE (POUR BTL) OPTIME
TOPICAL | Status: DC | PRN
  Administered 2021-05-31: 1000 mL

## 2021-05-31 SURGICAL SUPPLY — 46 items
ADH SKN CLS APL DERMABOND .7 (GAUZE/BANDAGES/DRESSINGS) ×1
APL PRP STRL LF DISP 70% ISPRP (MISCELLANEOUS) ×1
APPLIER CLIP 5 13 M/L LIGAMAX5 (MISCELLANEOUS) ×2
APR CLP MED LRG 5 ANG JAW (MISCELLANEOUS) ×1
BAG COUNTER SPONGE SURGICOUNT (BAG) IMPLANT
BAG SPEC RTRVL LRG 6X4 10 (ENDOMECHANICALS) ×1
BAG SPNG CNTER NS LX DISP (BAG)
CHLORAPREP W/TINT 26 (MISCELLANEOUS) ×2 IMPLANT
CLIP APPLIE 5 13 M/L LIGAMAX5 (MISCELLANEOUS) ×1 IMPLANT
COVER SURGICAL LIGHT HANDLE (MISCELLANEOUS) ×2 IMPLANT
DECANTER SPIKE VIAL GLASS SM (MISCELLANEOUS) ×2 IMPLANT
DERMABOND ADVANCED (GAUZE/BANDAGES/DRESSINGS) ×1
DERMABOND ADVANCED .7 DNX12 (GAUZE/BANDAGES/DRESSINGS) ×1 IMPLANT
DRAPE C-ARM 42X120 X-RAY (DRAPES) IMPLANT
DRAPE SHEET LG 3/4 BI-LAMINATE (DRAPES) IMPLANT
ELECT L-HOOK LAP 45CM DISP (ELECTROSURGICAL)
ELECT PENCIL ROCKER SW 15FT (MISCELLANEOUS) ×2 IMPLANT
ELECT REM PT RETURN 15FT ADLT (MISCELLANEOUS) ×2 IMPLANT
ELECTRODE L-HOOK LAP 45CM DISP (ELECTROSURGICAL) IMPLANT
GLOVE SURG POLYISO LF SZ5.5 (GLOVE) ×2 IMPLANT
GLOVE SURG UNDER POLY LF SZ6 (GLOVE) ×2 IMPLANT
GOWN STRL REUS W/TWL LRG LVL3 (GOWN DISPOSABLE) ×2 IMPLANT
GOWN STRL REUS W/TWL XL LVL3 (GOWN DISPOSABLE) ×4 IMPLANT
GRASPER SUT TROCAR 14GX15 (MISCELLANEOUS) IMPLANT
HEMOSTAT SNOW SURGICEL 2X4 (HEMOSTASIS) IMPLANT
IRRIG SUCT STRYKERFLOW 2 WTIP (MISCELLANEOUS) ×2
IRRIGATION SUCT STRKRFLW 2 WTP (MISCELLANEOUS) ×1 IMPLANT
KIT BASIN OR (CUSTOM PROCEDURE TRAY) ×2 IMPLANT
KIT TURNOVER KIT A (KITS) IMPLANT
L-HOOK LAP DISP 36CM (ELECTROSURGICAL) ×2
LHOOK LAP DISP 36CM (ELECTROSURGICAL) ×1 IMPLANT
NDL INSUFFLATION 14GA 120MM (NEEDLE) IMPLANT
NEEDLE INSUFFLATION 14GA 120MM (NEEDLE) IMPLANT
POUCH SPECIMEN RETRIEVAL 10MM (ENDOMECHANICALS) ×2 IMPLANT
SCISSORS LAP 5X35 DISP (ENDOMECHANICALS) ×2 IMPLANT
SET CHOLANGIOGRAPH MIX (MISCELLANEOUS) IMPLANT
SET TUBE SMOKE EVAC HIGH FLOW (TUBING) ×2 IMPLANT
SLEEVE XCEL OPT CAN 5 100 (ENDOMECHANICALS) ×4 IMPLANT
SUT MNCRL AB 4-0 PS2 18 (SUTURE) ×2 IMPLANT
TOWEL OR 17X26 10 PK STRL BLUE (TOWEL DISPOSABLE) ×2 IMPLANT
TOWEL OR NON WOVEN STRL DISP B (DISPOSABLE) IMPLANT
TRAY LAPAROSCOPIC (CUSTOM PROCEDURE TRAY) ×2 IMPLANT
TROCAR BLADELESS OPT 5 100 (ENDOMECHANICALS) ×2 IMPLANT
TROCAR OPTI BLUNT TIP 12M 100M (ENDOMECHANICALS) ×1 IMPLANT
TROCAR XCEL 12X100 BLDLESS (ENDOMECHANICALS) IMPLANT
TROCAR XCEL BLUNT TIP 100MML (ENDOMECHANICALS) IMPLANT

## 2021-05-31 NOTE — Transfer of Care (Signed)
Immediate Anesthesia Transfer of Care Note  Patient: Katrina Brennan  Procedure(s) Performed: LAPAROSCOPIC CHOLECYSTECTOMY  Patient Location: PACU  Anesthesia Type:General  Level of Consciousness: awake, alert  and oriented  Airway & Oxygen Therapy: Patient Spontanous Breathing and Patient connected to face mask oxygen  Post-op Assessment: Report given to RN and Post -op Vital signs reviewed and stable  Post vital signs: Reviewed and stable  Last Vitals:  Vitals Value Taken Time  BP 128/88 05/31/21 0847  Temp    Pulse 83 05/31/21 0847  Resp 22 05/31/21 0847  SpO2 100 % 05/31/21 0847  Vitals shown include unvalidated device data.  Last Pain:  Vitals:   05/31/21 0541  TempSrc:   PainSc: 0-No pain         Complications: No notable events documented.

## 2021-05-31 NOTE — Anesthesia Procedure Notes (Signed)
Procedure Name: Intubation Date/Time: 05/31/2021 7:37 AM Performed by: Maxwell Caul, CRNA Pre-anesthesia Checklist: Patient identified, Emergency Drugs available, Suction available and Patient being monitored Patient Re-evaluated:Patient Re-evaluated prior to induction Oxygen Delivery Method: Circle system utilized Preoxygenation: Pre-oxygenation with 100% oxygen Induction Type: IV induction Ventilation: Mask ventilation without difficulty Laryngoscope Size: Mac and 4 Grade View: Grade I Tube type: Oral Tube size: 7.0 mm Number of attempts: 1 Airway Equipment and Method: Stylet Placement Confirmation: ETT inserted through vocal cords under direct vision, positive ETCO2 and breath sounds checked- equal and bilateral Secured at: 21 cm Tube secured with: Tape Dental Injury: Teeth and Oropharynx as per pre-operative assessment

## 2021-05-31 NOTE — Discharge Instructions (Addendum)
[facility] DISCHARGE INSTRUCTIONS  Activity No heavy lifting greater than 15 pounds for 4 weeks after surgery. Ok to shower, but do not bathe or submerge incisions underwater. Do not drive while taking narcotic pain medication.  Wound Care Your incisions are covered with skin glue called Dermabond. This will peel off on its own over time. You may shower in 24 hours after surgery and allow warm soapy water to run over your incisions. Gently pat dry. Do not submerge your incision underwater. Monitor your incisions for any new redness, tenderness, or drainage.  When to Call us: Fever greater than 100.5 New redness, drainage, or swelling at incision site Severe pain, nausea, or vomiting Jaundice (yellowing of the whites of the eyes or skin)  Follow-up You have an appointment scheduled with Dr. Freida Busman on June 18, 2021 at 9:30am. This will be at the [facility] Surgery office at [address], [place], [place]. Please arrive at least 15 minutes prior to your scheduled appointment time.  For questions or concerns, please call the office at [phone].

## 2021-05-31 NOTE — H&P (Signed)
Katrina Brennan is an 45 y.o. female.   Chief Complaint: abdominal pain HPI: Katrina Brennan is a 46 yo female who has had multiple episodes of RUQ abdominal discomfort after meals, and recently had an ED visit due to a severe episode of RUQ pain. US showed gallbladder wall thickening. She presents today for cholecystectomy.  Past Medical History:  Diagnosis Date   High cholesterol    Hypertension     Past Surgical History:  Procedure Laterality Date   BREAST BIOPSY Left 2012   CESAREAN SECTION  2004   2007    History reviewed. No pertinent family history. Social History:  reports that she has never smoked. She has never used smokeless tobacco. She reports that she does not currently use alcohol. She reports that she does not currently use drugs.  Allergies:  Allergies  Allergen Reactions   Shellfish Allergy Hives and Itching    Medications Prior to Admission  Medication Sig Dispense Refill   diclofenac Sodium (VOLTAREN) 1 % GEL Apply 1 application topically 4 (four) times daily as needed (pain).     INCASSIA 0.35 MG tablet Take 1 tablet by mouth 4 (four) times a week.     loratadine (CLARITIN) 10 MG tablet Take 10 mg by mouth daily.     rosuvastatin (CRESTOR) 5 MG tablet Take 5 mg by mouth daily.     valsartan (DIOVAN) 80 MG tablet Take 80 mg by mouth in the morning.     acetaminophen (TYLENOL) 500 MG tablet Take 1,000 mg by mouth every 6 (six) hours as needed (for pain.).     HYDROcodone-acetaminophen (NORCO/VICODIN) 5-325 MG tablet Take 1 tablet by mouth every 6 (six) hours as needed. (Patient not taking: Reported on 05/16/2021) 10 tablet 0   ibuprofen (ADVIL) 200 MG tablet Take 400 mg by mouth every 8 (eight) hours as needed (for pain).     lidocaine (XYLOCAINE) 2 % solution Use as directed 15 mLs in the mouth or throat as needed for mouth pain. (Patient not taking: Reported on 05/16/2021) 100 mL 0   Polyethyl Glycol-Propyl Glycol (SYSTANE) 0.4-0.3 % SOLN Place 1-2 drops into  both eyes 3 (three) times daily as needed (dry/irritated eyes.).     SUMAtriptan (IMITREX) 100 MG tablet Take 100 mg by mouth every 2 (two) hours as needed for migraine. May repeat in 2 hours if headache persists or recurs.      Results for orders placed or performed during the hospital encounter of 05/31/21 (from the past 48 hour(s))  Pregnancy, urine     Status: None   Collection Time: 05/31/21  5:26 AM  Result Value Ref Range   Preg Test, Ur NEGATIVE NEGATIVE    Comment:        THE SENSITIVITY OF THIS METHODOLOGY IS >20 mIU/mL. Performed at [facility], [address] [place], [place] [address]    No results found.  Review of Systems  Constitutional:  Negative for fatigue and fever.  Respiratory:  Negative for shortness of breath and stridor.   Gastrointestinal:  Negative for nausea and vomiting.  Musculoskeletal:  Negative for gait problem.  Neurological:  Negative for syncope and weakness.   Blood pressure 127/83, pulse 70, temperature 99.3 F (37.4 C), temperature source Oral, resp. rate 16, height 4\' 11"  (1.499 m), weight 62.6 kg, last menstrual period 05/18/2021, SpO2 100 %. Physical Exam Vitals reviewed.  Constitutional:      Appearance: Normal appearance.  HENT:     Head: Normocephalic  and atraumatic.  Eyes:     General: No scleral icterus.    Conjunctiva/sclera: Conjunctivae normal.  Pulmonary:     Effort: Pulmonary effort is normal. No respiratory distress.  Abdominal:     General: There is no distension.     Palpations: Abdomen is soft.     Tenderness: There is no abdominal tenderness.  Musculoskeletal:        General: No swelling. Normal range of motion.     Cervical back: Normal range of motion.  Skin:    General: Skin is warm and dry.     Coloration: Skin is not jaundiced.  Neurological:     General: No focal deficit present.     Mental Status: She is alert and oriented to person, place, and time.  Psychiatric:        Mood and  Affect: Mood normal.        Behavior: Behavior normal.        Thought Content: Thought content normal.     Assessment/Plan 45 yo female with biliary dyskinesia. Proceed to OR for laparoscopic cholecystectomy. Plan for discharge home from PACU. Informed consent obtained, all questions answered.  Fritzi Mandes, MD 05/31/2021, 7:24 AM

## 2021-05-31 NOTE — Anesthesia Postprocedure Evaluation (Signed)
Anesthesia Post Note  Patient: Katrina Brennan  Procedure(s) Performed: LAPAROSCOPIC CHOLECYSTECTOMY     Patient location during evaluation: PACU Anesthesia Type: General Level of consciousness: awake and alert Pain management: pain level controlled Vital Signs Assessment: post-procedure vital signs reviewed and stable Respiratory status: spontaneous breathing, nonlabored ventilation, respiratory function stable and patient connected to nasal cannula oxygen Cardiovascular status: blood pressure returned to baseline and stable Postop Assessment: no apparent nausea or vomiting Anesthetic complications: no   No notable events documented.  Last Vitals:  Vitals:   05/31/21 0900 05/31/21 0915  BP: 135/84 140/80  Pulse: 81 75  Resp: 20 19  Temp:  36.4 C  SpO2: 99% 97%    Last Pain:  Vitals:   05/31/21 0915  TempSrc:   PainSc: 2                  Trevor Iha

## 2021-05-31 NOTE — Op Note (Signed)
Date: 05/31/21  Patient: Katrina Brennan MRN: [id number]  Preoperative Diagnosis: Biliary dyskinesia Postoperative Diagnosis: Same  Procedure: Laparoscopic cholecystectomy  Surgeon: Sophronia Simas, MD  EBL: Minimal  Anesthesia: General endotracheal  Specimens: Gallbladder  Indications: Katrina Brennan is a 45 yo female who presented with intermittent postprandial RUQ pain and discomfort, and recently had an ED visit with severe RUQ pain. US showed mild gallbladder wall thickening and a small polyp. After an extensive discussion of the risks and benefits of surgery, she agreed to proceed with cholecystectomy.  Findings: Mild gallbladder inflammation, no cholelithiasis or evidence of acute cholecystitis.  Procedure details: Informed consent was obtained in the preoperative area prior to the procedure. The patient was brought to the operating room and placed on the table in the supine position. General anesthesia was induced and appropriate lines and drains were placed for intraoperative monitoring. Perioperative antibiotics were administered per SCIP guidelines. The abdomen was prepped and draped in the usual sterile fashion. A pre-procedure timeout was taken verifying patient identity, surgical site and procedure to be performed.  A small infraumbilical skin incision was made, the subcutaneous tissue was divided with cautery, and the umbilical stalk was grasped and elevated. The fascia was incised and the peritoneal cavity was directly visualized. A 76mm Hassan trocar was placed. The peritoneal cavity was inspected with no evidence of visceral or vascular injury. Three 33mm ports were placed in the right subcostal margin, all under direct visualization. The fundus of the gallbladder was grasped and retracted cephalad. There were some omental adhesions to the liver adjacent to the gallbladder and these were carefully taken down with cautery. The infundibulum was retracted laterally. The cystic triangle  was dissected out using cautery and blunt dissection, and the critical view of safety was obtained. The cystic duct and cystic artery were clipped and ligated, leaving two clips behind on the cystic duct stump. The gallbladder was taken off the liver using cautery. The specimen was placed in an endocatch bag and removed. The surgical site was irrigated with saline until the effluent was clear. Hemostasis was achieved in the gallbladder fossa using cautery. The cystic duct and artery stumps were visually inspected and there was no evidence of bile leak or bleeding. The ports were removed under direct visualization and the abdomen was desufflated. The umbilical port site fascia was closed with a 0 vicryl suture. The skin at all port sites was closed with 4-0 monocryl subcuticular suture. Dermabond was applied.  The patient tolerated the procedure with no apparent complications. All counts were correct x2 at the end of the procedure. The patient was extubated and taken to PACU in stable condition.  Sophronia Simas, MD 05/31/21 8:35 AM

## 2021-06-01 ENCOUNTER — Encounter (HOSPITAL_COMMUNITY): Payer: Self-pay | Admitting: Surgery

## 2021-06-03 LAB — SURGICAL PATHOLOGY

## 2021-06-11 ENCOUNTER — Ambulatory Visit: Payer: BC Managed Care – PPO

## 2021-06-13 ENCOUNTER — Other Ambulatory Visit: Payer: Self-pay

## 2021-06-13 ENCOUNTER — Ambulatory Visit
Admission: RE | Admit: 2021-06-13 | Discharge: 2021-06-13 | Disposition: A | Discharge status: Home / Self Care | Payer: BC Managed Care – PPO | Source: Ambulatory Visit | Attending: Family Medicine | Admitting: Family Medicine

## 2021-06-13 DIAGNOSIS — Z1231 Encounter for screening mammogram for malignant neoplasm of breast: Secondary | ICD-10-CM

## 2021-06-17 ENCOUNTER — Other Ambulatory Visit: Payer: Self-pay | Admitting: Family Medicine

## 2021-06-17 DIAGNOSIS — R928 Other abnormal and inconclusive findings on diagnostic imaging of breast: Secondary | ICD-10-CM

## 2021-08-01 ENCOUNTER — Ambulatory Visit: Payer: BC Managed Care – PPO

## 2021-08-01 ENCOUNTER — Ambulatory Visit
Admission: RE | Admit: 2021-08-01 | Discharge: 2021-08-01 | Disposition: A | Discharge status: Home / Self Care | Payer: BC Managed Care – PPO | Source: Ambulatory Visit | Attending: Family Medicine | Admitting: Family Medicine

## 2021-08-01 DIAGNOSIS — R928 Other abnormal and inconclusive findings on diagnostic imaging of breast: Secondary | ICD-10-CM

## 2022-03-24 ENCOUNTER — Encounter: Payer: Self-pay | Admitting: Psychiatry

## 2022-04-02 ENCOUNTER — Other Ambulatory Visit: Payer: Self-pay | Admitting: Family Medicine

## 2022-04-02 DIAGNOSIS — N6489 Other specified disorders of breast: Secondary | ICD-10-CM

## 2022-04-16 ENCOUNTER — Ambulatory Visit
Admission: RE | Admit: 2022-04-16 | Discharge: 2022-04-16 | Disposition: A | Discharge status: Home / Self Care | Payer: BC Managed Care – PPO | Source: Ambulatory Visit | Attending: Family Medicine | Admitting: Family Medicine

## 2022-04-16 ENCOUNTER — Other Ambulatory Visit: Payer: Self-pay | Admitting: Family Medicine

## 2022-04-16 DIAGNOSIS — N6489 Other specified disorders of breast: Secondary | ICD-10-CM

## 2022-04-21 ENCOUNTER — Ambulatory Visit: Payer: BC Managed Care – PPO | Admitting: Psychiatry

## 2023-06-04 ENCOUNTER — Other Ambulatory Visit: Payer: Self-pay | Admitting: Family Medicine

## 2023-06-04 DIAGNOSIS — Z1231 Encounter for screening mammogram for malignant neoplasm of breast: Secondary | ICD-10-CM

## 2023-06-09 ENCOUNTER — Encounter: Payer: Self-pay | Admitting: Nurse Practitioner

## 2023-06-09 DIAGNOSIS — Z1231 Encounter for screening mammogram for malignant neoplasm of breast: Secondary | ICD-10-CM

## 2023-07-02 ENCOUNTER — Ambulatory Visit
Admission: RE | Admit: 2023-07-02 | Discharge: 2023-07-02 | Disposition: A | Discharge status: Home / Self Care | Payer: BC Managed Care – PPO | Source: Ambulatory Visit | Attending: Family Medicine | Admitting: Family Medicine

## 2023-07-02 DIAGNOSIS — Z1231 Encounter for screening mammogram for malignant neoplasm of breast: Secondary | ICD-10-CM

## 2023-07-18 IMAGING — MG MM DIGITAL DIAGNOSTIC UNILAT*R* W/ TOMO W/ CAD
4 series · 4 of 12 positions shown · non-contrast
Comparison: Previous exam(s).

CLINICAL DATA: Recall for possible asymmetry in the right breast.

EXAM:
DIGITAL DIAGNOSTIC UNILATERAL RIGHT MAMMOGRAM WITH TOMOSYNTHESIS AND
CAD
TECHNIQUE: Right digital diagnostic mammography and breast tomosynthesis was
performed. The images were evaluated with computer-aided detection.

[R MLO synth-2D]
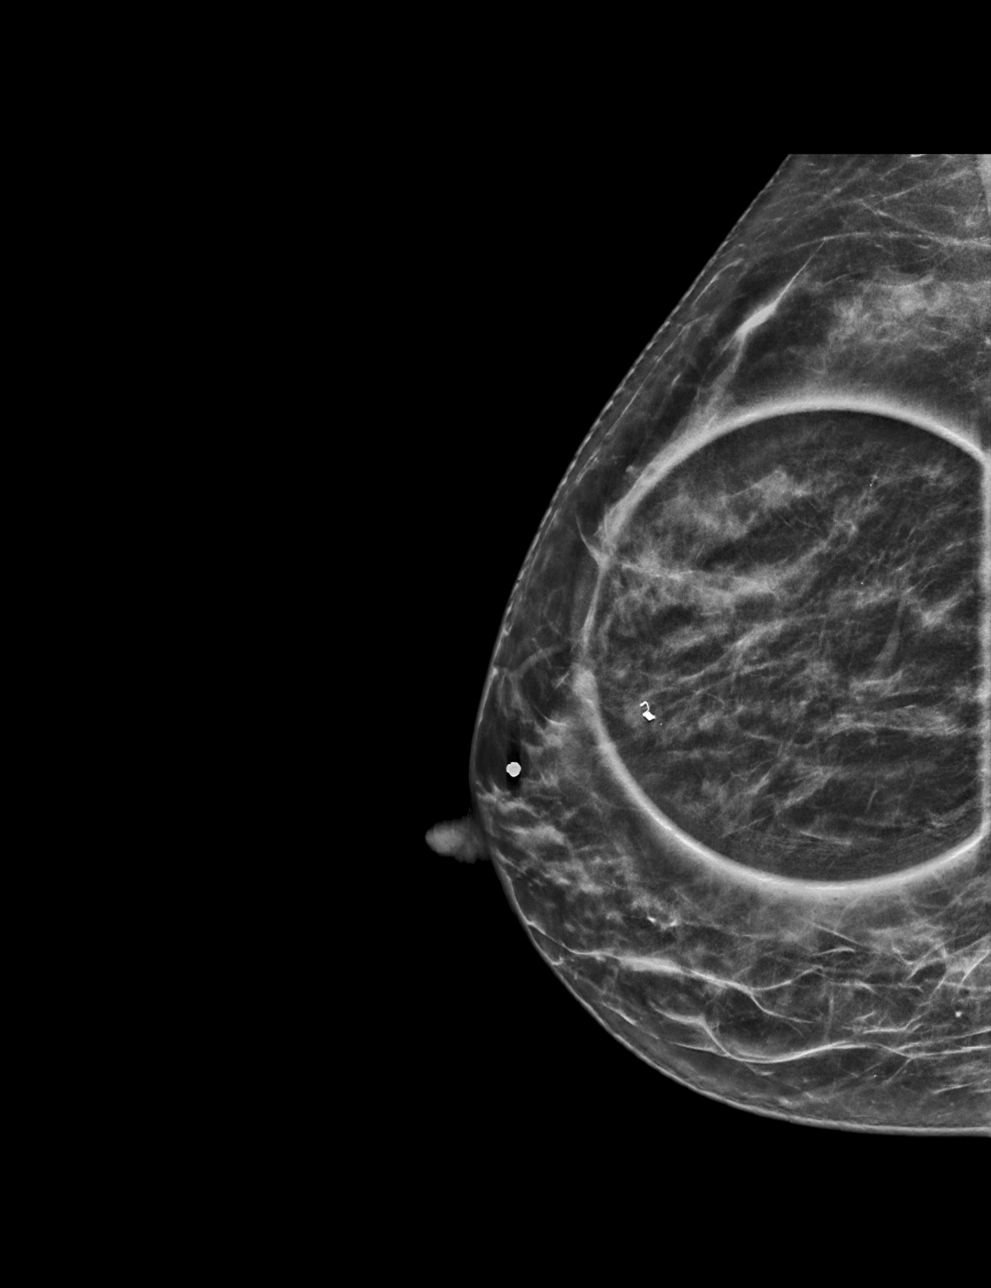

[R ML synth-2D]
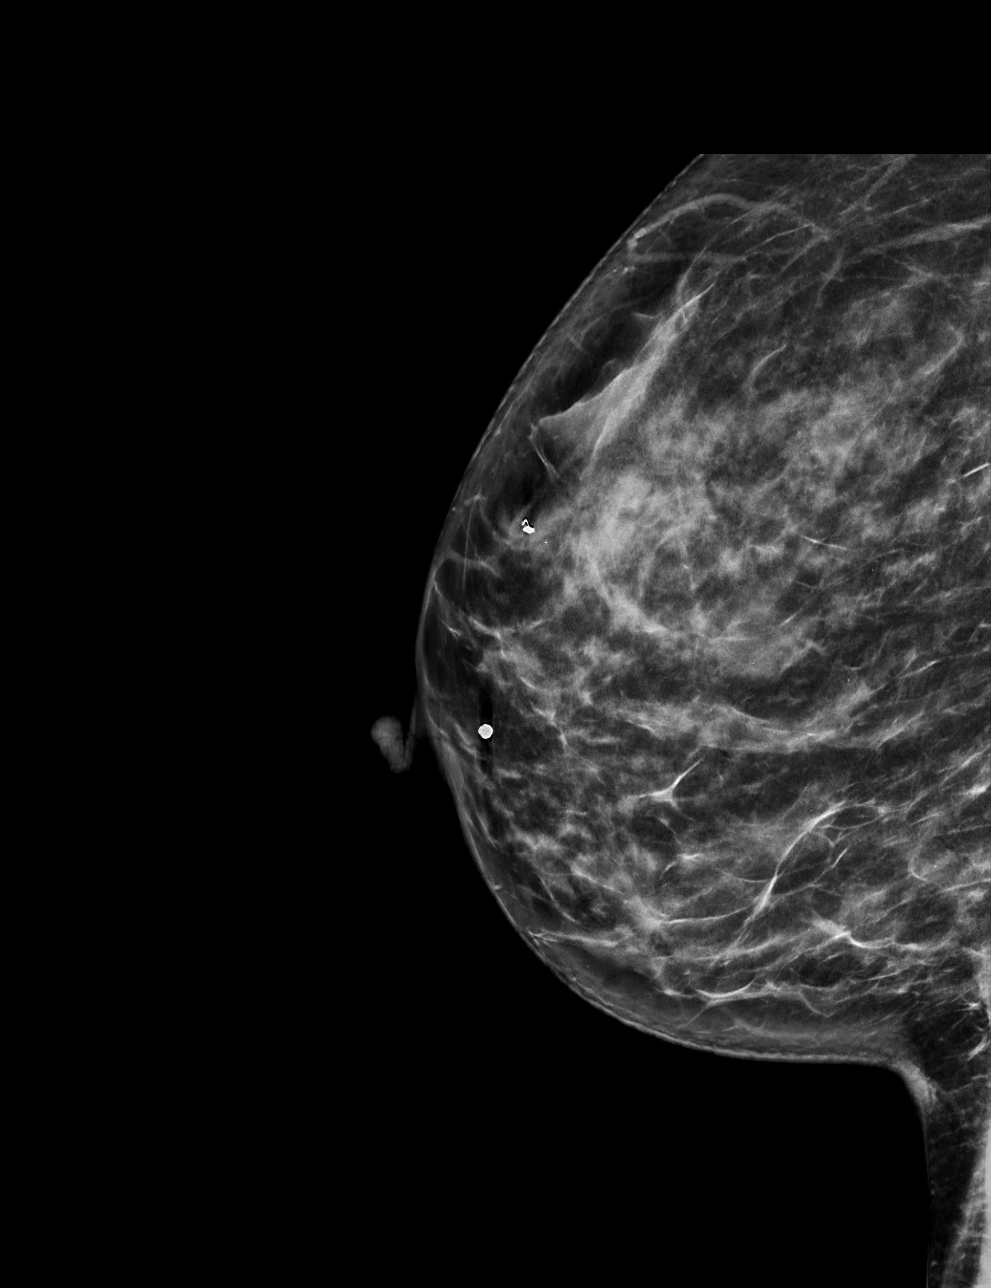

[R ML tomo · tomo slice 33/66.0]
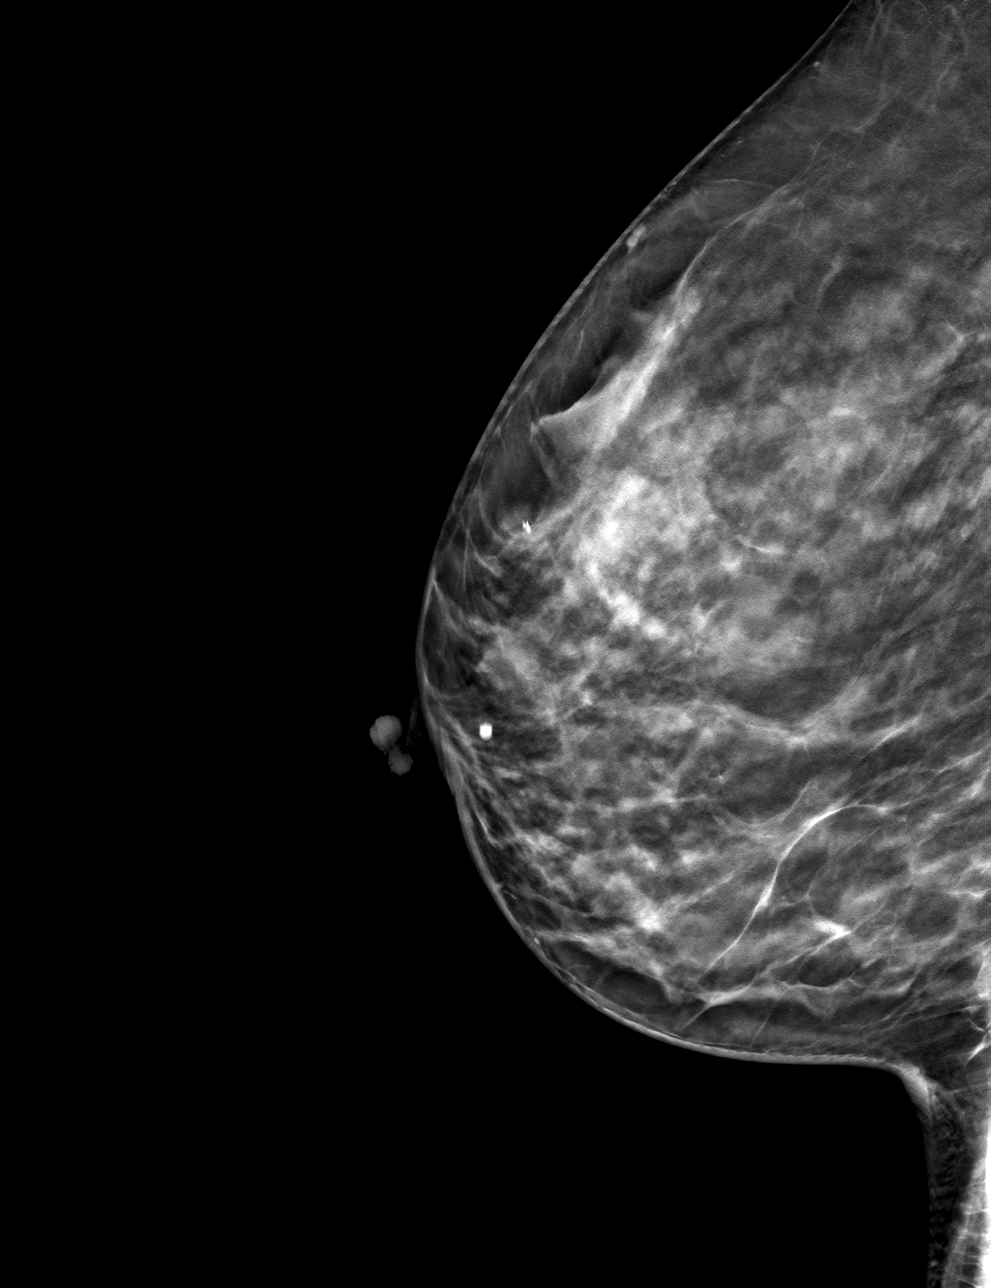

[R MLO tomo · tomo slice 29/57.0]
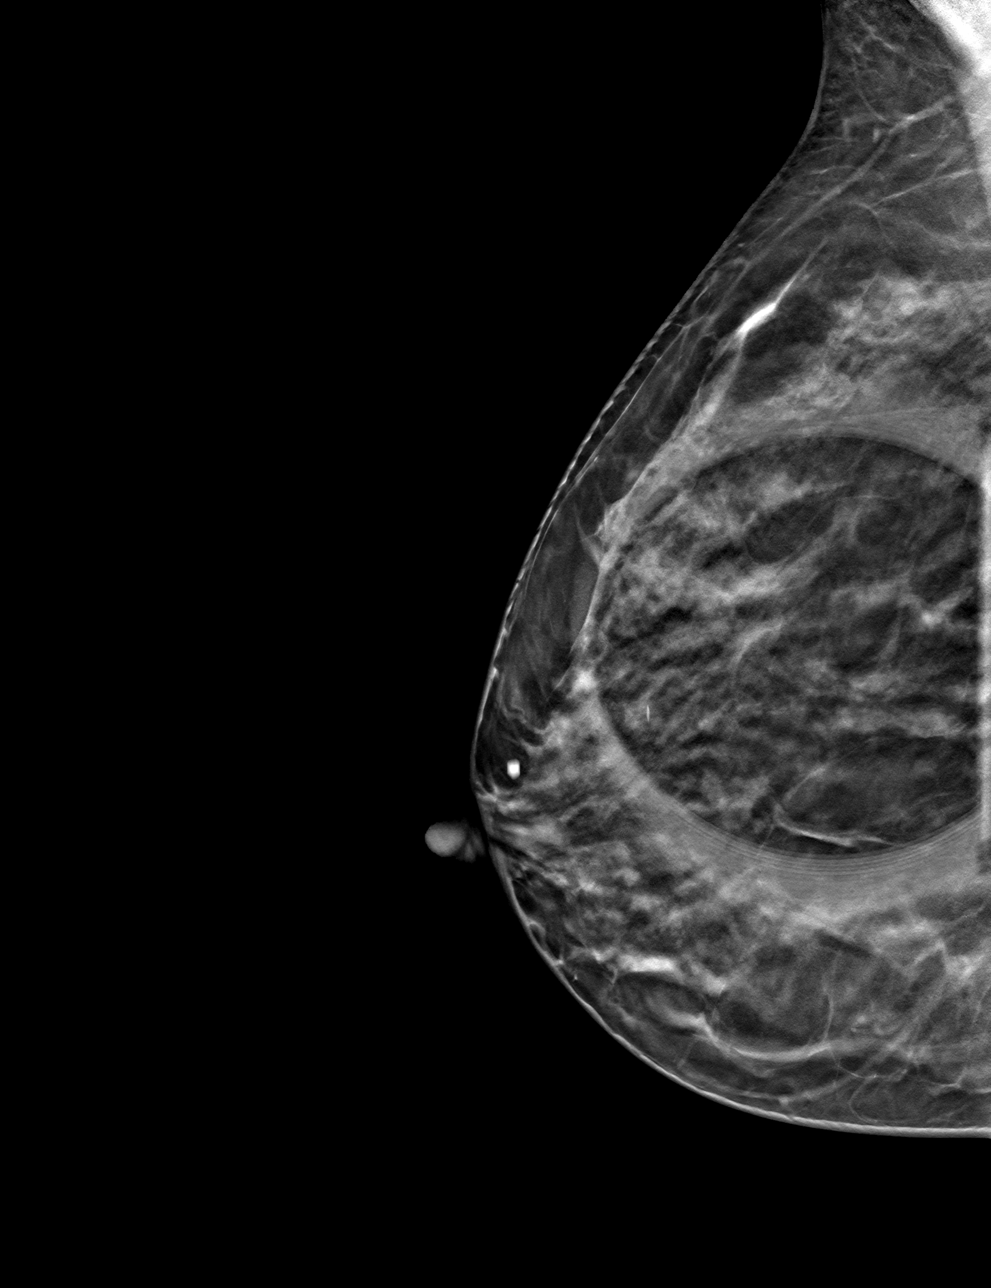

[4 of 12 positions shown; findings below may reference images not displayed]

ACR Breast Density Category c: The breast tissue is heterogeneously
dense, which may obscure small masses.
FINDINGS: The previously noted possible asymmetry in the upper right breast
seen on MLO view only dissipates on additional views, consistent
with overlapping fibroglandular tissue. No suspicious findings on
additional imaging.
IMPRESSION: No mammographic findings of malignancy in the right breast.

RECOMMENDATION:
Annual screening mammogram in 1 year.

I have discussed the findings and recommendations with the patient.
If applicable, a reminder letter will be sent to the patient
regarding the next appointment.

BI-RADS CATEGORY  1: Negative.

## 2024-06-03 ENCOUNTER — Other Ambulatory Visit: Payer: Self-pay | Admitting: Family Medicine

## 2024-06-03 DIAGNOSIS — Z1231 Encounter for screening mammogram for malignant neoplasm of breast: Secondary | ICD-10-CM

## 2024-07-05 ENCOUNTER — Ambulatory Visit
Admission: RE | Admit: 2024-07-05 | Discharge: 2024-07-05 | Disposition: A | Discharge status: Home / Self Care | Source: Ambulatory Visit | Attending: Family Medicine | Admitting: Family Medicine

## 2024-07-05 DIAGNOSIS — Z1231 Encounter for screening mammogram for malignant neoplasm of breast: Secondary | ICD-10-CM
